# Patient Record
Sex: Female | Born: 1990 | Race: Black or African American | Hispanic: No | Marital: Single | Smoking: Former smoker
Health system: Southern US, Community
[De-identification: ages and names within clinical notes are randomized; demographics above are authoritative.]

## PROBLEM LIST (undated history)

## (undated) DIAGNOSIS — F32A Depression, unspecified: Secondary | ICD-10-CM

## (undated) DIAGNOSIS — I10 Essential (primary) hypertension: Secondary | ICD-10-CM

## (undated) DIAGNOSIS — G43909 Migraine, unspecified, not intractable, without status migrainosus: Secondary | ICD-10-CM

## (undated) DIAGNOSIS — J45909 Unspecified asthma, uncomplicated: Secondary | ICD-10-CM

## (undated) HISTORY — DX: Migraine, unspecified, not intractable, without status migrainosus: G43.909

## (undated) HISTORY — DX: Essential (primary) hypertension: I10

---

## 2020-11-03 ENCOUNTER — Ambulatory Visit (INDEPENDENT_AMBULATORY_CARE_PROVIDER_SITE_OTHER): Payer: Medicaid Other | Admitting: Obstetrics and Gynecology

## 2020-11-03 ENCOUNTER — Encounter: Payer: Self-pay | Admitting: Obstetrics and Gynecology

## 2020-11-03 ENCOUNTER — Other Ambulatory Visit: Payer: Self-pay

## 2020-11-03 VITALS — BP 129/84 | HR 71 | Ht 62.0 in | Wt 206.3 lb

## 2020-11-03 DIAGNOSIS — Z3A36 36 weeks gestation of pregnancy: Secondary | ICD-10-CM

## 2020-11-03 DIAGNOSIS — O34219 Maternal care for unspecified type scar from previous cesarean delivery: Secondary | ICD-10-CM

## 2020-11-03 DIAGNOSIS — Z5900 Homelessness unspecified: Secondary | ICD-10-CM

## 2020-11-03 DIAGNOSIS — O0933 Supervision of pregnancy with insufficient antenatal care, third trimester: Secondary | ICD-10-CM | POA: Diagnosis not present

## 2020-11-03 DIAGNOSIS — Z7689 Persons encountering health services in other specified circumstances: Secondary | ICD-10-CM

## 2020-11-03 LAB — POCT URINALYSIS DIPSTICK OB
Bilirubin, UA: NEGATIVE
Blood, UA: NEGATIVE
Glucose, UA: NEGATIVE
Ketones, UA: NEGATIVE
Leukocytes, UA: NEGATIVE
Nitrite, UA: NEGATIVE
POC,PROTEIN,UA: NEGATIVE
Spec Grav, UA: 1.01 (ref 1.010–1.025)
Urobilinogen, UA: 0.2 E.U./dL
pH, UA: 7 (ref 5.0–8.0)

## 2020-11-03 NOTE — Progress Notes (Signed)
HPI:      [name] is a 30 y.o. [phone] who LMP was Patient's last menstrual period was 02/15/2020.  Subjective:   She presents today to establish care at our practice.  She has had very limited prenatal care during this pregnancy.  She is currently homeless and living in her car with her 3 children.  Abigail Watts is working with her to find housing/shelter. Her previous pregnancies were somewhat complicated.  She had 3 prior cesarean deliveries at term.  She had a previous ectopic pregnancy. She had an extensive visit approximately 2 weeks ago for prenatal care.  She had an ultrasound at that time.  She has completed her 1 hour GCT, is vaccinated for Covid, has done [facility] and chlamydia testing.  She has not yet done GBS. Patient states that she has a history of hypertension but she has not been hypertensive during this pregnancy. She says that she has depression and anxiety.  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  She has been taking it for less than 2 weeks. She reports active daily fetal movement.  She does complain of irregular daily contractions.    Hx: The following portions of the patient's history were reviewed and updated as appropriate:             She  has a past medical history of Hypertension and Migraines. She does not have a problem list on file. She  has no past surgical history on file. Her family history is not on file. She  reports that she has quit smoking. She has never used smokeless tobacco. She reports current alcohol use. She reports previous drug use. She has a current medication list which includes the following prescription(s): acetaminophen-codeine, multivitamin-prenatal, and sertraline. She is allergic to lisinopril and latex.       Review of Systems:  Review of Systems  Constitutional: Denied constitutional symptoms, night sweats, recent illness,  fatigue, fever, insomnia and weight loss.  Eyes: Denied eye symptoms, eye pain, photophobia, vision change and visual disturbance.  Ears/Nose/Throat/Neck: Denied ear, nose, throat or neck symptoms, hearing loss, nasal discharge, sinus congestion and sore throat.  Cardiovascular: Denied cardiovascular symptoms, arrhythmia, chest pain/pressure, edema, exercise intolerance, orthopnea and palpitations.  Respiratory: Denied pulmonary symptoms, asthma, pleuritic pain, productive sputum, cough, dyspnea and wheezing.  Gastrointestinal: Denied, gastro-esophageal reflux, melena, nausea and vomiting.  Genitourinary: Denied genitourinary symptoms including symptomatic vaginal discharge, pelvic relaxation issues, and urinary complaints.  Musculoskeletal: Denied musculoskeletal symptoms, stiffness, swelling, muscle weakness and myalgia.  Dermatologic: Denied dermatology symptoms, rash and scar.  Neurologic: Denied neurology symptoms, dizziness, headache, neck pain and syncope.  Psychiatric: Denied psychiatric symptoms, anxiety and depression.  Endocrine: Denied endocrine symptoms including hot flashes and night sweats.   Meds:   Current Outpatient Medications on File Prior to Visit  Medication Sig Dispense Refill  . acetaminophen-codeine (TYLENOL #3) 300-30 MG tablet Take by mouth.    . Prenatal Vit-Fe Fumarate-FA (MULTIVITAMIN-PRENATAL) 27-0.8 MG TABS tablet Take 1 tablet by mouth daily at 12 noon.    . sertraline (ZOLOFT) 25 MG tablet Take by mouth.     No current facility-administered medications on file prior to visit.          Objective:     Vitals:   11/03/20 1114  BP: 129/84  Pulse: 71   Filed Weights   11/03/20 1114  Weight: 206 lb  4.8 oz (93.6 kg)              Positive fetal heart tones 138 bpm    Assessment:    G5P3013 There are no problems to display for this patient.    1. Encounter to establish care   2. [redacted] weeks gestation of pregnancy   3. Limited prenatal care in  third trimester   4. Previous cesarean delivery affecting pregnancy, antepartum   5. Homeless family        Plan:            1.  Coordinate with Rose to attempt housing.  2.  Cesarean delivery scheduled for 1/28. (Repeat)  3.  GBS done today  4.  Discussed use of Zoloft and depression and taking medication as directed. Orders Orders Placed This Encounter  Procedures  . POC Urinalysis Dipstick OB    No orders of the defined types were placed in this encounter.     F/U  Return in about 1 week (around 11/10/2020) for [facility]. I spent 48 minutes involved in the care of this patient preparing to see the patient by obtaining and reviewing her medical history (including labs, imaging tests and prior procedures), documenting clinical information in the electronic health record (EHR), counseling and coordinating care plans, writing and sending prescriptions, ordering tests or procedures and directly communicating with the patient by discussing pertinent items from her history and physical exam as well as detailing my assessment and plan as noted above so that she has an informed understanding.  All of her questions were answered.  Elonda Husky, M.D. 11/03/2020 12:08 PM

## 2020-11-07 ENCOUNTER — Encounter: Payer: Medicaid Other | Admitting: Obstetrics and Gynecology

## 2020-11-08 LAB — STREP GP B NAA: Strep Gp B NAA: POSITIVE — AB

## 2020-11-15 ENCOUNTER — Telehealth: Payer: Self-pay

## 2020-11-15 NOTE — Telephone Encounter (Signed)
Called pt and add her to dr. Valentino Saxon for 11

## 2020-11-15 NOTE — Telephone Encounter (Signed)
Please schedule with Abigail Watts since she no showed the visit with [facility].

## 2020-11-15 NOTE — Telephone Encounter (Signed)
pt called in and stated that she is have pain in her left leg. The pt said that she is having some discharge. the pt is requesting to see the doctor before she has her c-section. I told the pt I will send a message to the nurse due to her discomfort and that a nurse will be in touch. Please advise

## 2020-11-16 ENCOUNTER — Telehealth: Payer: Self-pay

## 2020-11-16 ENCOUNTER — Encounter: Payer: Medicaid Other | Admitting: Obstetrics and Gynecology

## 2020-11-16 NOTE — Telephone Encounter (Signed)
Tried to call patient went straight to voicemail. Voicemail not set up.

## 2020-11-16 NOTE — Telephone Encounter (Signed)
Patient called in stating that she needed to cancel her appointment for today as she states she won't be able to come into the office. Patient states that she would like a nurse to give her a call back in regards to braxton hick contractions she is having, patient states that pain is shooting down her left leg. Patient denies any loss of mucus plug, vaginal bleeding, or discharge. Informed patient I would send a message back to her provider and that the nurse would be in touch.   Could you please advise?

## 2020-11-21 ENCOUNTER — Other Ambulatory Visit
Admission: RE | Admit: 2020-11-21 | Discharge: 2020-11-21 | Disposition: A | Discharge status: Home / Self Care | Payer: Medicaid Other | Source: Ambulatory Visit | Attending: Obstetrics and Gynecology | Admitting: Obstetrics and Gynecology

## 2020-11-21 ENCOUNTER — Other Ambulatory Visit: Payer: Self-pay

## 2020-11-21 HISTORY — DX: Depression, unspecified: F32.A

## 2020-11-21 HISTORY — DX: Unspecified asthma, uncomplicated: J45.909

## 2020-11-21 NOTE — Patient Instructions (Signed)
Your procedure is scheduled on: Friday November 24, 2020. Report to Day [facility] inside [facility] (stop by Admissions desk first). To find out your arrival time please call [phone] between 1PM - 3PM on Thursday November 23, 2020.  Remember: Instructions that are not followed completely may result in serious medical risk,  up to and including death, or upon the discretion of your surgeon and anesthesiologist your  surgery may need to be rescheduled.     _X__ 1. Do not eat food after midnight the night before your procedure.                 No chewing gum or hard candies. You may drink clear liquids up to 2 hours                 before you are scheduled to arrive for your surgery- DO not drink clear                 liquids within 2 hours of the start of your surgery.                 Clear Liquids include:  water, apple juice without pulp, clear Gatorade, G2 or                  Gatorade Zero (avoid Red/Purple/Blue), Black Coffee or Tea (Do not add                 anything to coffee or tea).  __X__2.  On the morning of surgery brush your teeth with toothpaste and water, you                may rinse your mouth with mouthwash if you wish.  Do not swallow any toothpaste of mouthwash.     _X__ 3.  No Alcohol for 24 hours before or after surgery.   _X__ 4.  Do Not Smoke or use e-cigarettes For 24 Hours Prior to Your Surgery.                 Do not use any chewable tobacco products for at least 6 hours prior to                 Surgery.  _X__  5.  Do not use any recreational drugs (marijuana, cocaine, heroin, ecstasy, MDMA or other)                For at least one week prior to your surgery.  Combination of these drugs with anesthesia                May have life threatening results.  __X__ 6.  Notify your doctor if there is any change in your medical condition      (cold, fever, infections).     Do not wear jewelry, make-up, hairpins, clips or nail polish. Do not  wear lotions, powders, or perfumes. You may wear deodorant. Do not shave 48 hours prior to surgery. Men may shave face and neck. Do not bring valuables to the hospital.    [facility] is not responsible for any belongings or valuables.  Contacts, dentures or bridgework may not be worn into surgery. Leave your suitcase in the car. After surgery it may be brought to your room. For patients admitted to the hospital, discharge time is determined by your treatment team.   Patients discharged the day of surgery will not be allowed to drive home.   Make arrangements for  someone to be with you for the first 24 hours of your Same Day Discharge.    __X__ Take these medicines the morning of surgery with A SIP OF WATER:    1. None     ____ Fleet Enema (as directed)   __X__ Use CHG Soap (or wipes) as directed  ____ Use Benzoyl Peroxide Gel as instructed  ____ Use inhalers on the day of surgery  ____ Stop metformin 2 days prior to surgery     __X__ Stop Anti-inflammatories Ibuprofen, Aleve, Advil, naproxen, aspirin and or BC powders.    __X__ Stop supplements until after surgery.    __X__ Do not start any herbal supplements before your procedure.     If you have any questions regarding your pre-procedure instructions,  Please call Pre-admit Testing at [phone].

## 2020-11-22 ENCOUNTER — Encounter
Admission: RE | Admit: 2020-11-22 | Discharge: 2020-11-22 | Disposition: A | Discharge status: Home / Self Care | Payer: Medicaid Other | Source: Ambulatory Visit | Attending: Obstetrics and Gynecology | Admitting: Obstetrics and Gynecology

## 2020-11-22 ENCOUNTER — Other Ambulatory Visit
Admission: RE | Admit: 2020-11-22 | Discharge: 2020-11-22 | Disposition: A | Discharge status: Home / Self Care | Payer: Medicaid Other | Source: Ambulatory Visit | Attending: Obstetrics and Gynecology | Admitting: Obstetrics and Gynecology

## 2020-11-22 DIAGNOSIS — Z01812 Encounter for preprocedural laboratory examination: Secondary | ICD-10-CM | POA: Insufficient documentation

## 2020-11-22 DIAGNOSIS — Z20822 Contact with and (suspected) exposure to covid-19: Secondary | ICD-10-CM | POA: Diagnosis not present

## 2020-11-22 LAB — CBC
HCT: 32.8 % — ABNORMAL LOW (ref 36.0–46.0)
Hemoglobin: 10.5 g/dL — ABNORMAL LOW (ref 12.0–15.0)
MCH: 24.1 pg — ABNORMAL LOW (ref 26.0–34.0)
MCHC: 32 g/dL (ref 30.0–36.0)
MCV: 75.4 fL — ABNORMAL LOW (ref 80.0–100.0)
Platelets: 165 10*3/uL (ref 150–400)
RBC: 4.35 MIL/uL (ref 3.87–5.11)
RDW: 15.2 % (ref 11.5–15.5)
WBC: 5.8 10*3/uL (ref 4.0–10.5)
nRBC: 0 % (ref 0.0–0.2)

## 2020-11-22 LAB — SARS CORONAVIRUS 2 (TAT 6-24 HRS): SARS Coronavirus 2: NEGATIVE

## 2020-11-22 LAB — TYPE AND SCREEN
ABO/RH(D): B POS
Antibody Screen: NEGATIVE
Extend sample reason: UNDETERMINED

## 2020-11-22 LAB — HCG, QUANTITATIVE, PREGNANCY: hCG, Beta Chain, Quant, S: 12186 m[IU]/mL — ABNORMAL HIGH (ref <5)

## 2020-11-23 ENCOUNTER — Ambulatory Visit (INDEPENDENT_AMBULATORY_CARE_PROVIDER_SITE_OTHER): Payer: Medicaid Other | Admitting: Obstetrics and Gynecology

## 2020-11-23 ENCOUNTER — Other Ambulatory Visit: Payer: Self-pay

## 2020-11-23 ENCOUNTER — Encounter: Payer: Self-pay | Admitting: Obstetrics and Gynecology

## 2020-11-23 VITALS — BP 141/84 | HR 91 | Wt 211.7 lb

## 2020-11-23 DIAGNOSIS — O0933 Supervision of pregnancy with insufficient antenatal care, third trimester: Secondary | ICD-10-CM

## 2020-11-23 DIAGNOSIS — Z3A38 38 weeks gestation of pregnancy: Secondary | ICD-10-CM

## 2020-11-23 DIAGNOSIS — O34219 Maternal care for unspecified type scar from previous cesarean delivery: Secondary | ICD-10-CM

## 2020-11-23 NOTE — Progress Notes (Signed)
ROB: Patient has no specific complaints.  We discussed her surgery for tomorrow.  N.p.o. after midnight discussed.  Postop recovery and care in the hospital discussed.  All questions answered.

## 2020-11-23 NOTE — Telephone Encounter (Signed)
Spoke with patient and scheduled her to come in today to be seen.

## 2020-11-24 ENCOUNTER — Encounter
Admission: RE | Disposition: A | Discharge status: Home / Self Care | Payer: Self-pay | Source: Home / Self Care | Attending: Obstetrics and Gynecology

## 2020-11-24 ENCOUNTER — Encounter: Payer: Self-pay | Admitting: Obstetrics and Gynecology

## 2020-11-24 ENCOUNTER — Other Ambulatory Visit: Payer: Self-pay

## 2020-11-24 ENCOUNTER — Inpatient Hospital Stay: Payer: Medicaid Other | Admitting: Anesthesiology

## 2020-11-24 ENCOUNTER — Inpatient Hospital Stay
Admission: RE | Admit: 2020-11-24 | Discharge: 2020-11-26 | DRG: 788 | Disposition: A | Discharge status: Home / Self Care | Payer: Medicaid Other | Attending: Obstetrics and Gynecology | Admitting: Obstetrics and Gynecology

## 2020-11-24 DIAGNOSIS — O99824 Streptococcus B carrier state complicating childbirth: Secondary | ICD-10-CM

## 2020-11-24 DIAGNOSIS — O99344 Other mental disorders complicating childbirth: Secondary | ICD-10-CM | POA: Diagnosis present

## 2020-11-24 DIAGNOSIS — Z87891 Personal history of nicotine dependence: Secondary | ICD-10-CM

## 2020-11-24 DIAGNOSIS — O99893 Other specified diseases and conditions complicating puerperium: Secondary | ICD-10-CM | POA: Diagnosis present

## 2020-11-24 DIAGNOSIS — R109 Unspecified abdominal pain: Secondary | ICD-10-CM | POA: Diagnosis present

## 2020-11-24 DIAGNOSIS — F32A Depression, unspecified: Secondary | ICD-10-CM | POA: Diagnosis present

## 2020-11-24 DIAGNOSIS — O34211 Maternal care for low transverse scar from previous cesarean delivery: Secondary | ICD-10-CM | POA: Diagnosis present

## 2020-11-24 DIAGNOSIS — O0933 Supervision of pregnancy with insufficient antenatal care, third trimester: Secondary | ICD-10-CM | POA: Diagnosis not present

## 2020-11-24 DIAGNOSIS — Z3A39 39 weeks gestation of pregnancy: Secondary | ICD-10-CM

## 2020-11-24 DIAGNOSIS — O0973 Supervision of high risk pregnancy due to social problems, third trimester: Secondary | ICD-10-CM | POA: Diagnosis not present

## 2020-11-24 DIAGNOSIS — Z5901 Sheltered homelessness: Secondary | ICD-10-CM

## 2020-11-24 DIAGNOSIS — Z3490 Encounter for supervision of normal pregnancy, unspecified, unspecified trimester: Secondary | ICD-10-CM

## 2020-11-24 LAB — ABO/RH: ABO/RH(D): B POS

## 2020-11-24 LAB — RAPID HIV SCREEN (HIV 1/2 AB+AG)
HIV 1/2 Antibodies: NONREACTIVE
HIV-1 P24 Antigen - HIV24: NONREACTIVE

## 2020-11-24 SURGERY — Surgical Case
Anesthesia: Spinal

## 2020-11-24 MED ORDER — CEFAZOLIN SODIUM-DEXTROSE 2-4 GM/100ML-% IV SOLN
INTRAVENOUS | Status: AC
  Filled 2020-11-24: qty 100

## 2020-11-24 MED ORDER — MEPERIDINE HCL 25 MG/ML IJ SOLN: 6.2500 mg | INTRAMUSCULAR | Status: DC | PRN

## 2020-11-24 MED ORDER — SODIUM CHLORIDE 0.9% FLUSH: 3.0000 mL | INTRAVENOUS | Status: DC | PRN

## 2020-11-24 MED ORDER — ORAL CARE MOUTH RINSE: 15.0000 mL | Freq: Once | OROMUCOSAL | Status: DC

## 2020-11-24 MED ORDER — FLUCONAZOLE 50 MG PO TABS
150.0000 mg | ORAL_TABLET | Freq: Once | ORAL | Status: AC
  Administered 2020-11-24: 150 mg via ORAL
  Filled 2020-11-24: qty 1

## 2020-11-24 MED ORDER — FENTANYL CITRATE (PF) 100 MCG/2ML IJ SOLN
INTRAMUSCULAR | Status: AC
  Filled 2020-11-24: qty 2

## 2020-11-24 MED ORDER — SOD CITRATE-CITRIC ACID 500-334 MG/5ML PO SOLN
ORAL | Status: AC
  Administered 2020-11-24: 15 mL via ORAL
  Filled 2020-11-24: qty 15

## 2020-11-24 MED ORDER — NALBUPHINE HCL 10 MG/ML IJ SOLN
5.0000 mg | Freq: Once | INTRAMUSCULAR | Status: AC | PRN
Start: 2020-11-24 — End: 2020-11-24
  Administered 2020-11-24: 5 mg via INTRAVENOUS
  Filled 2020-11-24: qty 1

## 2020-11-24 MED ORDER — NALOXONE HCL 4 MG/10ML IJ SOLN
1.0000 ug/kg/h | INTRAVENOUS | Status: DC | PRN
  Filled 2020-11-24: qty 5

## 2020-11-24 MED ORDER — BUPIVACAINE IN DEXTROSE 0.75-8.25 % IT SOLN
INTRATHECAL | Status: DC | PRN
  Administered 2020-11-24: 1.6 mL via INTRATHECAL

## 2020-11-24 MED ORDER — KETOROLAC TROMETHAMINE 30 MG/ML IJ SOLN
30.0000 mg | Freq: Four times a day (QID) | INTRAMUSCULAR | Status: AC | PRN
  Administered 2020-11-24 – 2020-11-25 (×3): 30 mg via INTRAVENOUS
  Filled 2020-11-24 (×3): qty 1

## 2020-11-24 MED ORDER — ONDANSETRON HCL 4 MG/2ML IJ SOLN
INTRAMUSCULAR | Status: DC | PRN
  Administered 2020-11-24: 4 mg via INTRAVENOUS

## 2020-11-24 MED ORDER — PROMETHAZINE HCL 25 MG/ML IJ SOLN: 6.2500 mg | INTRAMUSCULAR | Status: DC | PRN

## 2020-11-24 MED ORDER — SIMETHICONE 80 MG PO CHEW
80.0000 mg | CHEWABLE_TABLET | Freq: Four times a day (QID) | ORAL | Status: DC | PRN
  Administered 2020-11-24 – 2020-11-25 (×2): 80 mg via ORAL
  Filled 2020-11-24 (×3): qty 1

## 2020-11-24 MED ORDER — ONDANSETRON HCL 4 MG/2ML IJ SOLN
INTRAMUSCULAR | Status: AC
  Filled 2020-11-24: qty 2

## 2020-11-24 MED ORDER — KETOROLAC TROMETHAMINE 30 MG/ML IJ SOLN
INTRAMUSCULAR | Status: AC
  Filled 2020-11-24: qty 1

## 2020-11-24 MED ORDER — PHENYLEPHRINE HCL (PRESSORS) 10 MG/ML IV SOLN
INTRAVENOUS | Status: DC | PRN
  Administered 2020-11-24: 100 ug via INTRAVENOUS

## 2020-11-24 MED ORDER — OXYTOCIN-SODIUM CHLORIDE 30-0.9 UT/500ML-% IV SOLN
INTRAVENOUS | Status: DC | PRN
  Administered 2020-11-24: 30 [IU] via INTRAVENOUS

## 2020-11-24 MED ORDER — FENTANYL CITRATE (PF) 100 MCG/2ML IJ SOLN
INTRAMUSCULAR | Status: DC | PRN
  Administered 2020-11-24: 15 ug via INTRATHECAL

## 2020-11-24 MED ORDER — OXYTOCIN-SODIUM CHLORIDE 30-0.9 UT/500ML-% IV SOLN
INTRAVENOUS | Status: AC
  Filled 2020-11-24: qty 500

## 2020-11-24 MED ORDER — NALOXONE HCL 0.4 MG/ML IJ SOLN: 0.4000 mg | INTRAMUSCULAR | Status: DC | PRN

## 2020-11-24 MED ORDER — LACTATED RINGERS IV SOLN: INTRAVENOUS | Status: DC

## 2020-11-24 MED ORDER — DIPHENHYDRAMINE HCL 50 MG/ML IJ SOLN: 12.5000 mg | INTRAMUSCULAR | Status: DC | PRN

## 2020-11-24 MED ORDER — NALBUPHINE HCL 10 MG/ML IJ SOLN: 5.0000 mg | INTRAMUSCULAR | Status: DC | PRN

## 2020-11-24 MED ORDER — LACTATED RINGERS IV SOLN: Freq: Once | INTRAVENOUS | Status: DC

## 2020-11-24 MED ORDER — NALBUPHINE HCL 10 MG/ML IJ SOLN
5.0000 mg | Freq: Once | INTRAMUSCULAR | Status: AC | PRN
Start: 2020-11-24 — End: 2020-11-24

## 2020-11-24 MED ORDER — HYDROCODONE-ACETAMINOPHEN 5-325 MG PO TABS
1.0000 | ORAL_TABLET | Freq: Four times a day (QID) | ORAL | Status: DC | PRN
  Administered 2020-11-24 – 2020-11-25 (×4): 2 via ORAL
  Filled 2020-11-24 (×5): qty 2

## 2020-11-24 MED ORDER — SODIUM CHLORIDE 0.9 % IV SOLN
INTRAVENOUS | Status: DC | PRN
  Administered 2020-11-24: 40 ug/min via INTRAVENOUS

## 2020-11-24 MED ORDER — ONDANSETRON HCL 4 MG/2ML IJ SOLN
4.0000 mg | Freq: Three times a day (TID) | INTRAMUSCULAR | Status: DC | PRN
Start: 2020-11-24 — End: 2020-11-26

## 2020-11-24 MED ORDER — OXYCODONE HCL 5 MG PO TABS
5.0000 mg | ORAL_TABLET | ORAL | Status: DC | PRN
  Administered 2020-11-24 – 2020-11-25 (×4): 5 mg via ORAL
  Filled 2020-11-24 (×3): qty 1

## 2020-11-24 MED ORDER — CHLORHEXIDINE GLUCONATE 0.12 % MT SOLN
15.0000 mL | Freq: Once | OROMUCOSAL | Status: DC
  Filled 2020-11-24: qty 15

## 2020-11-24 MED ORDER — POVIDONE-IODINE 10 % EX SWAB: 2.0000 "application " | Freq: Once | CUTANEOUS | Status: DC

## 2020-11-24 MED ORDER — DIPHENHYDRAMINE HCL 25 MG PO CAPS: 25.0000 mg | ORAL_CAPSULE | ORAL | Status: DC | PRN

## 2020-11-24 MED ORDER — MORPHINE SULFATE (PF) 0.5 MG/ML IJ SOLN
INTRAMUSCULAR | Status: AC
  Filled 2020-11-24: qty 10

## 2020-11-24 MED ORDER — FENTANYL CITRATE (PF) 100 MCG/2ML IJ SOLN: 25.0000 ug | INTRAMUSCULAR | Status: DC | PRN

## 2020-11-24 MED ORDER — MORPHINE SULFATE (PF) 0.5 MG/ML IJ SOLN
INTRAMUSCULAR | Status: DC | PRN
  Administered 2020-11-24: .1 mg via INTRATHECAL

## 2020-11-24 MED ORDER — KETOROLAC TROMETHAMINE 30 MG/ML IJ SOLN: 30.0000 mg | Freq: Four times a day (QID) | INTRAMUSCULAR | Status: AC | PRN

## 2020-11-24 MED ORDER — OXYCODONE HCL 5 MG PO TABS
ORAL_TABLET | ORAL | Status: AC
  Filled 2020-11-24: qty 1

## 2020-11-24 MED ORDER — FAMOTIDINE 20 MG PO TABS
20.0000 mg | ORAL_TABLET | Freq: Once | ORAL | Status: DC
  Filled 2020-11-24: qty 1

## 2020-11-24 MED ORDER — CEFAZOLIN SODIUM-DEXTROSE 2-4 GM/100ML-% IV SOLN
2.0000 g | Freq: Once | INTRAVENOUS | Status: AC
  Administered 2020-11-24: 2 g via INTRAVENOUS

## 2020-11-24 MED ORDER — LIDOCAINE 5 % EX PTCH
MEDICATED_PATCH | CUTANEOUS | Status: AC
  Filled 2020-11-24: qty 1

## 2020-11-24 MED ORDER — SOD CITRATE-CITRIC ACID 500-334 MG/5ML PO SOLN
ORAL | Status: DC | PRN
  Administered 2020-11-24: 30 mL via ORAL

## 2020-11-24 MED ORDER — KETOROLAC TROMETHAMINE 30 MG/ML IJ SOLN
INTRAMUSCULAR | Status: DC | PRN
  Administered 2020-11-24: 30 mg via INTRAVENOUS

## 2020-11-24 SURGICAL SUPPLY — 27 items
ADHESIVE MASTISOL STRL (MISCELLANEOUS) ×2 IMPLANT
BAG COUNTER SPONGE EZ (MISCELLANEOUS) ×2 IMPLANT
CANISTER SUCT 3000ML PPV (MISCELLANEOUS) ×2 IMPLANT
CELL SAVER LIPIGURD (MISCELLANEOUS) ×1 IMPLANT
CHLORAPREP W/TINT 26 (MISCELLANEOUS) ×4 IMPLANT
COVER WAND RF STERILE (DRAPES) ×2 IMPLANT
DRSG TELFA 3X8 NADH (GAUZE/BANDAGES/DRESSINGS) ×2 IMPLANT
EXTRT SYSTEM ALEXIS 14CM (MISCELLANEOUS) ×2
GAUZE SPONGE 4X4 12PLY STRL (GAUZE/BANDAGES/DRESSINGS) ×2 IMPLANT
GLOVE BIOGEL PI ORTHO PRO 7.5 (GLOVE) ×1
GLOVE PI ORTHO PRO STRL 7.5 (GLOVE) ×1 IMPLANT
GOWN STRL REUS W/ TWL LRG LVL3 (GOWN DISPOSABLE) ×2 IMPLANT
GOWN STRL REUS W/TWL LRG LVL3 (GOWN DISPOSABLE) ×2
KIT TURNOVER KIT A (KITS) ×2 IMPLANT
MANIFOLD NEPTUNE II (INSTRUMENTS) ×2 IMPLANT
NS IRRIG 1000ML POUR BTL (IV SOLUTION) ×2 IMPLANT
PACK C SECTION AR (MISCELLANEOUS) ×2 IMPLANT
PAD OB MATERNITY 4.3X12.25 (PERSONAL CARE ITEMS) ×2 IMPLANT
PAD PREP 24X41 OB/GYN DISP (PERSONAL CARE ITEMS) ×2 IMPLANT
PENCIL SMOKE ULTRAEVAC 22 CON (MISCELLANEOUS) ×2 IMPLANT
RETRACTOR WND ALEXIS-O 25 LRG (MISCELLANEOUS) ×1 IMPLANT
RTRCTR WOUND ALEXIS O 25CM LRG (MISCELLANEOUS) ×2
SPONGE LAP 18X18 RF (DISPOSABLE) ×2 IMPLANT
SUT VIC AB 0 CTX 36 (SUTURE) ×2
SUT VIC AB 0 CTX36XBRD ANBCTRL (SUTURE) ×2 IMPLANT
SUT VIC AB 1 CT1 36 (SUTURE) ×4 IMPLANT
SUT VICRYL+ 3-0 36IN CT-1 (SUTURE) ×4 IMPLANT

## 2020-11-24 NOTE — Lactation Note (Signed)
This note was copied from a baby's chart. Lactation Consultation Note  Patient Name: Abigail Watts LSLHT'D Date: 11/24/2020 Reason for consult: L&D Initial assessment;Term;Infant weight loss;Other (Comment) (limited prenatal care) Age:29 hours  Maternal Data Formula Feeding for Exclusion: No Does the patient have breastfeeding experience prior to this delivery?: No Formula fed her other children Feeding  Had expressed a desire to pump and bottle feed, when educated how often and how she would need to pump, at least every 3 hrs, she indicated that she would not be able to to do this, she states she has an electric pump that ?[employer] gave her, she does not know the name brand, she is unsure about breastfeeding, had attempted at breast earlier but unable to latch baby, since baby has not fed at all since birth, she has decided to formula feed for now and think about breastfeeding, I informed her that I would keep checking with her and that I would help her with breastfeeding the baby if she desires to try this.  I informed Elmarie Shiley Denmark about her desire to formula feed at present and she will assist with the formula feeding.   LATCH Score Latch:  (no feeding observed)                 Interventions Interventions: Breast feeding basics reviewed, pumping frequency and info about pumping given  Lactation Tools Discussed/Used WIC Program: Yes   Consult Status Consult Status: PRN    [name] 11/24/2020, 10:34 AM

## 2020-11-24 NOTE — Lactation Note (Signed)
This note was copied from a baby's chart. Lactation Consultation Note  Patient Name: Abigail Watts ZOXWR'U Date: 11/24/2020 Reason for consult: Follow-up assessment;Term Age:29 hours Mom has decided to pump and bottlefeed, DEBP set up with instruction in use and cleaning, mom pumped in initiation mode and obtained a few drops of colostrum, she  Was instructed to pump again at 1930 and to call for assistance as needed with pumping and cleaning parts. Maternal Data Formula Feeding for Exclusion: No Does the patient have breastfeeding experience prior to this delivery?: No  Feeding Feeding Type: Bottle Fed - Formula Nipple Type: Slow - flow  LATCH Score                   Interventions Interventions: DEBP  Lactation Tools Discussed/Used Pump Education: Setup, frequency, and cleaning;Milk Storage Initiated by:: Cay Schillings RNC IBCLC Date initiated:: 11/24/20   Consult Status Consult Status: Follow-up Date: 11/25/20 Follow-up type: In-patient    [name] 11/24/2020, 6:05 PM

## 2020-11-24 NOTE — TOC Initial Note (Signed)
Transition of Care [facility]) - Initial/Assessment Note    Patient Details  Name: Abigail Watts MRN: [id number] Date of Birth: 13-Nov-1990  Transition of [facility]) CM/SW Contact:    Gholson Cellar, RN Phone Number: 11/24/2020, 2:54 PM  Clinical Narrative:                 RN CM spoke with patient at bedside regarding concerns for homelessness. Patient states she iscoming out of a rough situation due to COVID where she was living out of her car for a short period of time. Currently she and her 3 children are living with her grandmother in [place]. Patient has gotten a job at [employer] through a [employer] and will be starting once released from OB/GYN following Csection. Patient has a vehicle and drives. Grandmother watches the children while she works however they are currently staying with her aunt in [place]. Patient reports no person in the home smoke and she understands importance of hand hygiene. Patient will be bottlefeeding. Has WIC through [facility] and is working to transfer to [facility]. Patient is planning to move all her children to [facility]. Has no equipment other than one outfit that DSS Case worker-Kaitlyn provided to her. Patient reports she was working with Okey Dupre, SW at [employer]. 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         Patient Goals and CMS Choice        Expected Discharge Plan and Services                                                Prior Living Arrangements/Services                       Activities of Daily Living Home Assistive Devices/Equipment: None ADL Screening (condition at time of admission) Patient's cognitive ability adequate to safely complete daily activities?: Yes Is the patient deaf or have difficulty hearing?: No Does the patient have difficulty seeing, even when wearing glasses/contacts?: No Does the patient have difficulty  concentrating, remembering, or making decisions?: No Patient able to express need for assistance with ADLs?: No Does the patient have difficulty dressing or bathing?: No Independently performs ADLs?: Yes (appropriate for developmental age) Does the patient have difficulty walking or climbing stairs?: No Weakness of Legs: None Weakness of Arms/Hands: None  Permission Sought/Granted                  Emotional Assessment              Admission diagnosis:  Term Intrauterine pregnancy for repeat cesarean There are no problems to display for this patient.  PCP:  Linzie Collin, MD Pharmacy:   [facility] [place], [place] - [address] [place] [address] Phone: [phone] Fax: [phone]     Social Determinants of Health (SDOH) Interventions    Readmission Risk Interventions No flowsheet data found.

## 2020-11-24 NOTE — Transfer of Care (Signed)
Immediate Anesthesia Transfer of Care Note  Patient: Abigail Watts  Procedure(s) Performed: CESAREAN SECTION (N/A )  Patient Location: PACU  Anesthesia Type:Spinal  Level of Consciousness: awake, alert  and oriented  Airway & Oxygen Therapy: Patient Spontanous Breathing  Post-op Assessment: Report given to RN and Post -op Vital signs reviewed and stable  Post vital signs: Reviewed and stable  Last Vitals:  Vitals Value Taken Time  BP 110/64 11/24/20 0907  Temp    Pulse 60 11/24/20 0907  Resp 23 11/24/20 0907  SpO2 98 % 11/24/20 0907    Last Pain:  Vitals:   11/24/20 0354  TempSrc: Oral         Complications: No complications documented.

## 2020-11-24 NOTE — Anesthesia Procedure Notes (Signed)
Spinal  Patient location during procedure: OR Start time: 11/24/2020 7:52 AM End time: 11/24/2020 7:56 AM Staffing Performed: resident/CRNA  Anesthesiologist: Martha Clan, MD Resident/CRNA: Hedda Slade, CRNA Preanesthetic Checklist Completed: patient identified, IV checked, site marked, risks and benefits discussed, surgical consent, monitors and equipment checked, pre-op evaluation and timeout performed Spinal Block Patient position: sitting Prep: ChloraPrep Patient monitoring: heart rate, continuous pulse ox, blood pressure and cardiac monitor Approach: midline Location: L3-4 Injection technique: single-shot Needle Needle type: Whitacre and Introducer  Needle gauge: 24 G Needle length: 9 cm Assessment Sensory level: T6 Additional Notes Negative paresthesia. Negative blood return. Positive free-flowing CSF. Expiration date of kit checked and confirmed. Patient tolerated procedure well, without complications.

## 2020-11-24 NOTE — H&P (Signed)
History and Physical   HPI  Abigail Watts is a 30 y.o. [id number] at [redacted]w[redacted]d Estimated Date of Delivery: 12/01/20 who is being admitted for C-section - pt for repeat. Limited prenatal care.   OB History  OB History  Gravida Para Term Preterm AB Living  5 3 3  0 1 3  SAB IAB Ectopic Multiple Live Births  0 0 1 0 3    # Outcome Date GA Lbr Len/2nd Weight Sex Delivery Anes PTL Lv  5 Current           4 Term 08/13/18    M CS-LTranv   LIV  3 Ectopic 05/11/14          2 Term 03/16/14    M CS-LTranv  Y LIV  1 Term 07/14/07    F CS-LTranv   LIV    PROBLEM LIST  Pregnancy complications or risks: There are no problems to display for this patient.   Prenatal labs and studies: ABO, Rh: --/--/B POS Performed at [facility], [address] [place], [place]  [phone]) Antibody: NEG (01/26 1030) Rubella:   RPR:    HBsAg:    HIV:    [birth year]1980-- (01/10 03-24-1991)   Past Medical History:  Diagnosis Date  . Asthma   . Depression   . Hypertension   . Migraines      Past Surgical History:  Procedure Laterality Date  . CESAREAN SECTION     xs 3     Medications    Current Discharge Medication List    CONTINUE these medications which have NOT CHANGED   Details  acetaminophen (TYLENOL) 500 MG tablet Take 500 mg by mouth every 6 (six) hours as needed for moderate pain.    Prenatal Vit-Fe Fumarate-FA (MULTIVITAMIN-PRENATAL) 27-0.8 MG TABS tablet Take 1 tablet by mouth daily.    acetaminophen-codeine (TYLENOL #3) 300-30 MG tablet Take 1-1.5 tablets by mouth 2 (two) times daily as needed for moderate pain.    sertraline (ZOLOFT) 25 MG tablet Take 25 mg by mouth daily.         Allergies  Lisinopril and Latex  Review of Systems  Pertinent items noted in HPI and remainder of comprehensive ROS otherwise negative.  Physical Exam  BP 138/90 (BP Location: Left Arm)   Pulse 69   Temp 98.4 F (36.9 C) (Oral)   Resp 20   Ht 5\' 2"  (1.575 m)    Wt 95.7 kg   LMP 02/15/2020   BMI 38.59 kg/m   Lungs:  CTA B Cardio: RRR without M/R/G Abd: Soft, gravid, NT Presentation: cephalic EXT: No C/C/ 1+ Edema DTRs: 2+ B CERVIX:    See Prenatal records for more detailed PE.     FHR:  Variability: Good {> 6 bpm)  Toco: Uterine Contractions: Irreg  Test Results  Results for orders placed or performed during the hospital encounter of 11/24/20 (from the past 24 hour(s))  ABO/Rh     Status: None   Collection Time: 11/24/20  6:27 AM  Result Value Ref Range   ABO/RH(D)      B POS Performed at [facility], [address], [place], [address] [place]      Assessment   [place] at [redacted]w[redacted]d Estimated Date of Delivery: 12/01/20  The fetus is reassuring.   There are no problems to display for this patient.   Plan  1. Admit to L&D :   2. EFM: -- Category 1 3. Stadol or  Epidural if desired.   4. Admission labs  5. CD at 39 weeks  Elonda Husky, M.D. 11/24/2020 7:28 AM

## 2020-11-24 NOTE — TOC Progression Note (Addendum)
Transition of Care [facility]) - Progression Note    Patient Details  Name: Abigail Watts MRN: [id number] Date of Birth: Jun 02, 1991  [facility]) CM/SW Contact  [place] Cellar, RN Phone Number: 11/24/2020, 3:12 PM  Clinical Narrative:    RN CM discussed case with staff RN and confirmed hospital can provide car seat and will search for additional resources in the community for additional infant supplies.   Call to [facility] DSS CPS @ [phone]  Completed CPS report.   CPS report completed due to mother not having any needed items for infant. DSS requested hospital provide car seat, extra formula, diapers and wipes until they could make contact and assist.         Expected Discharge Plan and Services                                                 Social Determinants of Health (SDOH) Interventions    Readmission Risk Interventions No flowsheet data found.

## 2020-11-24 NOTE — Op Note (Signed)
      OP NOTE  Date: 11/24/2020   9:03 AM Name Abigail Watts MR# [id number]  Preoperative Diagnosis: 1. Intrauterine pregnancy at [redacted]w[redacted]d 2.  Pt desires repeat 3. Limited prenatal care  Postoperative Diagnosis: 1. Intrauterine pregnancy at [redacted]w[redacted]d, delivered 2. Viable infant 3. Remainder same as pre-op   Procedure: 1. Repeat Low-Transverse Cesarean Section  Surgeon: Elonda Husky, MD  Assistant:  Valentino Saxon MD  No other capable assistant was available for this surgery which requires an experienced, high level assistant.  She provided exposure, dissection, suctioning, retraction, and general support and assistance during the procedure.   Anesthesia: Spinal    EBL: 450 ml     Findings: 1) female infant, Apgar scores of 9   at 1 minute and 9   at 5 minutes and a birthweight of 119.58  ounces.    2) Normal uterus, tubes and ovaries.    Procedure:  The patient was prepped and draped in the supine position and placed under spinal anesthesia.  A transverse incision was made across the abdomen in a Pfannenstiel manner. If indicated the old scar was systematically removed with sharp dissection.  We carried the dissection down to the level of the fascia.  The fascia was incised in a curvilinear manner.  The fascia was then elevated from the rectus muscles with blunt and sharp dissection.  The rectus muscles were separated laterally exposing the peritoneum.  The peritoneum was carefully entered with care being taken to avoid bowel and bladder.  Adhesions from the uterus to the abdominal wall were encountered and systematically lysed. A self-retaining retractor was placed.  The visceral peritoneum was incised in a curvilinear fashion across the lower uterine segment creating a bladder flap. A transverse incision was made across the lower uterine segment and extended laterally and superiorly using the bandage scissors.  Artificial rupture membranes was performed and Clear fluid was noted.  The  infant was delivered from the cephalic position.  A nuchal cord was not present. After an appropriate time interval, the cord was doubly clamped and cut. Cord blood was obtained if required.  The infant was handed to the pediatric personnel  who then placed the infant under heat lamps where it was cleaned dried and suctioned as needed. The placenta was delivered. The hysterotomy incision was then identified on ring forceps.  The uterine cavity was cleaned with a moist lap sponge.  The hysterotomy incision was closed with a running interlocking suture of Vicryl.  Hemostasis was excellent.  Pitocin was run in the IV and the uterus was found to be firm. The posterior cul-de-sac and gutters were cleaned and inspected.  Hemostasis was noted.  The fascia was then closed with a running suture of #1 Vicryl.  Hemostasis of the subcutaneous tissues was obtained using the Bovie.  The subcutaneous tissues were closed with a running suture of 000 Vicryl.  A subcuticular suture was placed.  Steri-Strips were applied in the usual manner.  A pressure dressing was placed.  The patient went to the recovery room in stable condition.   Elonda Husky, M.D. 11/24/2020 9:03 AM

## 2020-11-24 NOTE — Anesthesia Preprocedure Evaluation (Signed)
Anesthesia Evaluation  Patient identified by MRN, date of birth, ID band Patient awake    Reviewed: Allergy & Precautions, H&P , NPO status , Patient's Chart, lab work & pertinent test results, reviewed documented beta blocker date and time   History of Anesthesia Complications Negative for: history of anesthetic complications  Airway Mallampati: II  TM Distance: >3 FB Neck ROM: full    Dental  (+) Teeth Intact, Dental Advidsory Given   Pulmonary neg shortness of breath, asthma , neg COPD, neg recent URI, former smoker,    Pulmonary exam normal breath sounds clear to auscultation       Cardiovascular Exercise Tolerance: Good hypertension, (-) angina(-) Past MI and (-) Cardiac Stents Normal cardiovascular exam(-) dysrhythmias (-) Valvular Problems/Murmurs Rhythm:regular Rate:Normal     Neuro/Psych  Headaches, neg Seizures PSYCHIATRIC DISORDERS Depression    GI/Hepatic negative GI ROS, Neg liver ROS,   Endo/Other  negative endocrine ROS  Renal/GU negative Renal ROS  negative genitourinary   Musculoskeletal   Abdominal   Peds  Hematology negative hematology ROS (+)   Anesthesia Other Findings Past Medical History: No date: Asthma No date: Depression No date: Hypertension No date: Migraines   Reproductive/Obstetrics negative OB ROS                             Anesthesia Physical Anesthesia Plan  ASA: II  Anesthesia Plan: Spinal   Post-op Pain Management:    Induction:   PONV Risk Score and Plan:   Airway Management Planned:   Additional Equipment:   Intra-op Plan:   Post-operative Plan:   Informed Consent: I have reviewed the patients History and Physical, chart, labs and discussed the procedure including the risks, benefits and alternatives for the proposed anesthesia with the patient or authorized representative who has indicated his/her understanding and acceptance.      Dental Advisory Given  Plan Discussed with: Anesthesiologist, CRNA and Surgeon  Anesthesia Plan Comments:         Anesthesia Quick Evaluation

## 2020-11-25 ENCOUNTER — Encounter: Payer: Self-pay | Admitting: Obstetrics and Gynecology

## 2020-11-25 ENCOUNTER — Inpatient Hospital Stay: Payer: Medicaid Other

## 2020-11-25 ENCOUNTER — Inpatient Hospital Stay (HOSPITAL_BASED_OUTPATIENT_CLINIC_OR_DEPARTMENT_OTHER): Payer: Medicaid Other

## 2020-11-25 LAB — CBC
HCT: 28.1 % — ABNORMAL LOW (ref 36.0–46.0)
Hemoglobin: 8.9 g/dL — ABNORMAL LOW (ref 12.0–15.0)
MCH: 24.3 pg — ABNORMAL LOW (ref 26.0–34.0)
MCHC: 31.7 g/dL (ref 30.0–36.0)
MCV: 76.6 fL — ABNORMAL LOW (ref 80.0–100.0)
Platelets: 132 10*3/uL — ABNORMAL LOW (ref 150–400)
RBC: 3.67 MIL/uL — ABNORMAL LOW (ref 3.87–5.11)
RDW: 15.1 % (ref 11.5–15.5)
WBC: 7.7 10*3/uL (ref 4.0–10.5)
nRBC: 0 % (ref 0.0–0.2)

## 2020-11-25 LAB — BASIC METABOLIC PANEL
Anion gap: 7 (ref 5–15)
BUN: 7 mg/dL (ref 6–20)
CO2: 22 mmol/L (ref 22–32)
Calcium: 8.4 mg/dL — ABNORMAL LOW (ref 8.9–10.3)
Chloride: 107 mmol/L (ref 98–111)
Creatinine, Ser: 0.68 mg/dL (ref 0.44–1.00)
GFR, Estimated: >60 mL/min (ref >60)
Glucose, Bld: 123 mg/dL — ABNORMAL HIGH (ref 70–99)
Potassium: 3.6 mmol/L (ref 3.5–5.1)
Sodium: 136 mmol/L (ref 135–145)

## 2020-11-25 LAB — HEPATITIS B SURFACE ANTIGEN

## 2020-11-25 LAB — RPR: RPR Ser Ql: NONREACTIVE

## 2020-11-25 LAB — RUBELLA SCREEN: Rubella: <0.9 index — ABNORMAL LOW (ref >0.99)

## 2020-11-25 IMAGING — CT CT ABD-PELV W/ CM
2 of 5 series · 16 of 46 positions shown, 18 images · IV contrast (APPLIED)
Comparison: None.

CLINICAL DATA: Abdominal pain, fever postop. Patient complained of
[DATE] pain that has been unrelieved by PRN pain medications ordered.
Cesarean section postop day 1.

EXAM:
CT ABDOMEN AND PELVIS WITH CONTRAST
TECHNIQUE: Multidetector CT imaging of the abdomen and pelvis was performed
using the standard protocol following bolus administration of
intravenous contrast.
CONTRAST:  Intravenous contrast administered.

[Series 2: routine abd/pel with · axial · 0.76mm/px · z∈[-887,-502]mm · 13 of 91 slices shown, 15 images]
[im 7/91  soft-tissue]
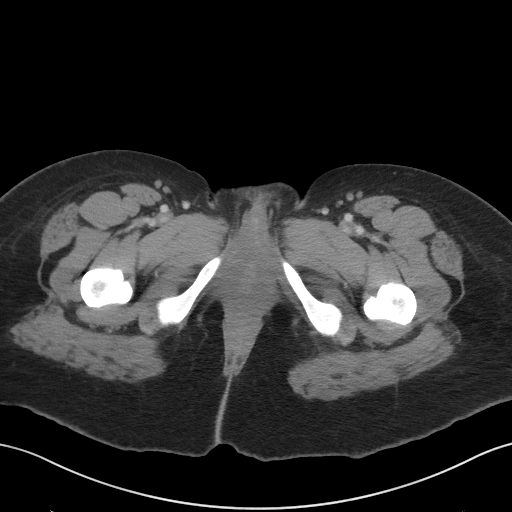
[im 7/91  bone]
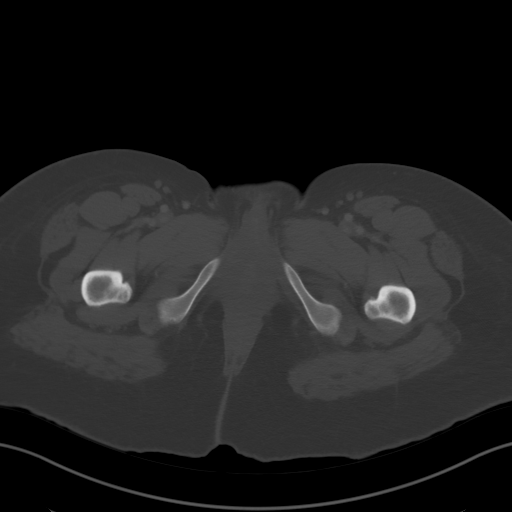
[im 13/91  soft-tissue]
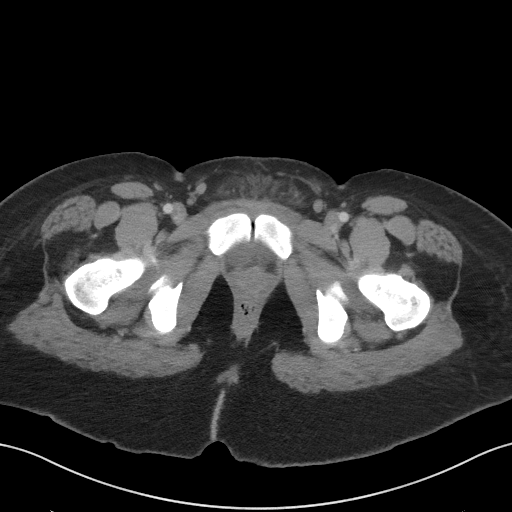
[im 20/91  soft-tissue]
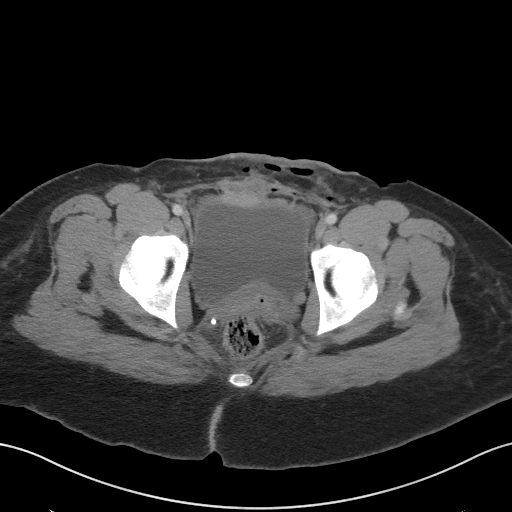
[im 26/91  soft-tissue]
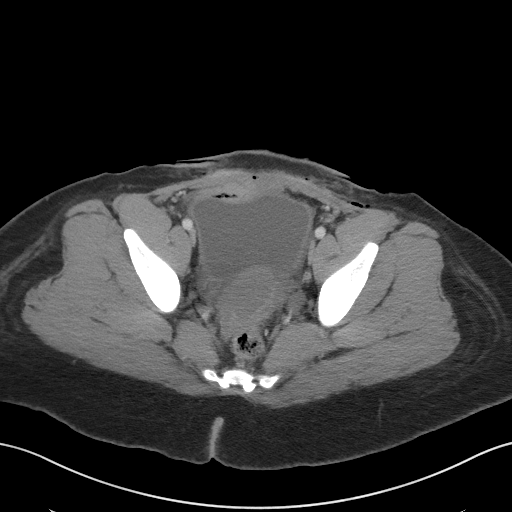
[im 33/91  soft-tissue]
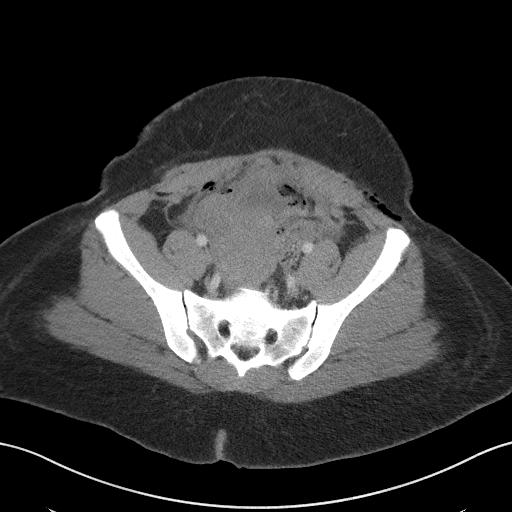
[im 39/91  soft-tissue]
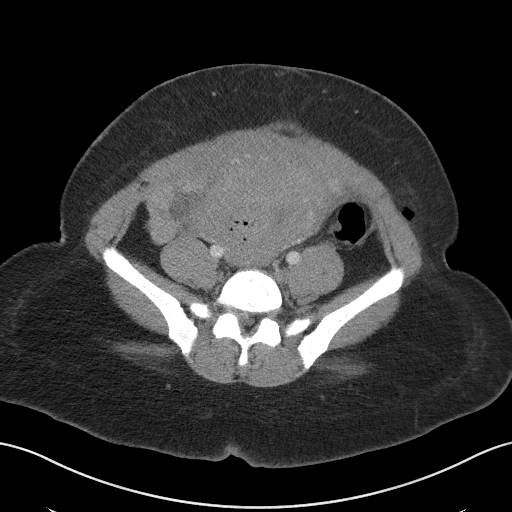
[im 46/91  soft-tissue]
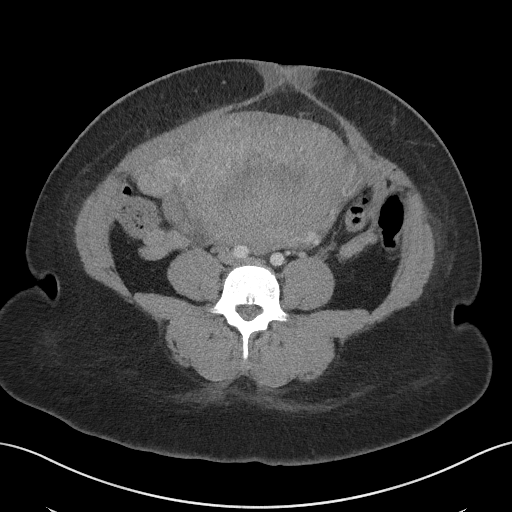
[im 52/91  soft-tissue]
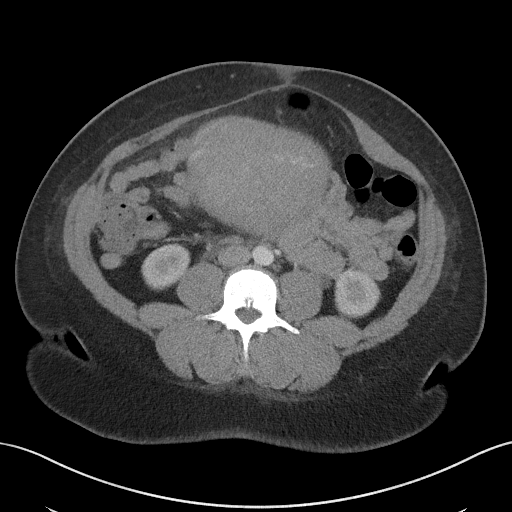
[im 58/91  soft-tissue]
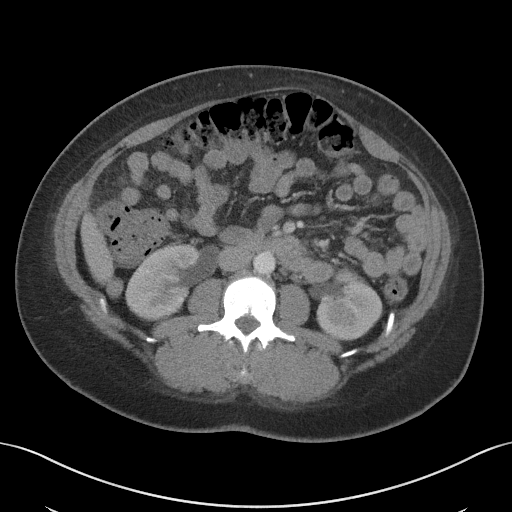
[im 58/91  bone]
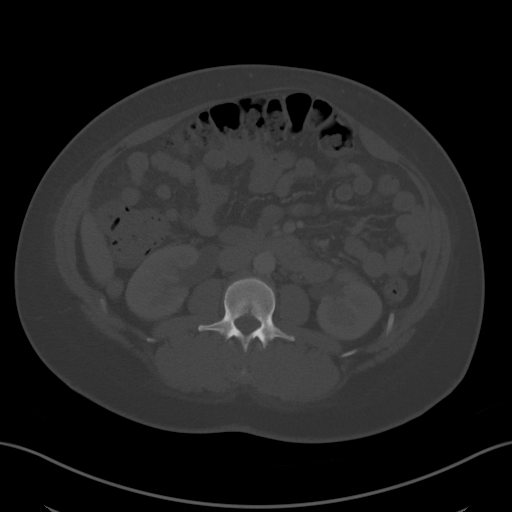
[im 65/91  soft-tissue]
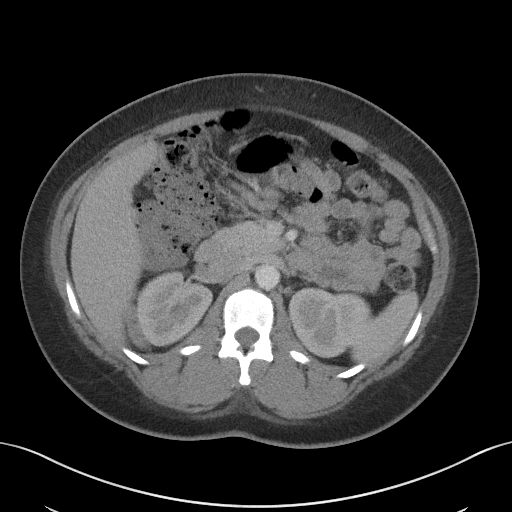
[im 71/91  soft-tissue]
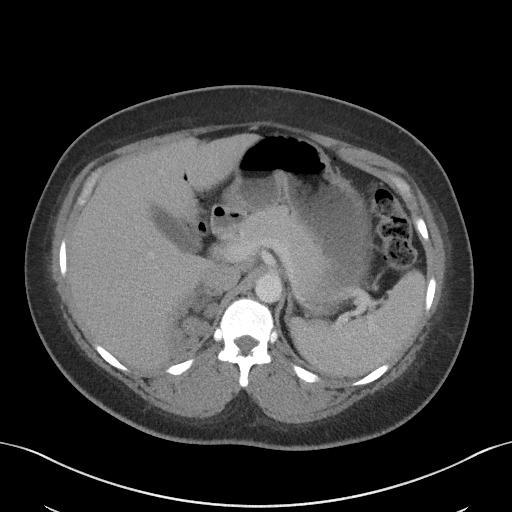
[im 78/91  soft-tissue]
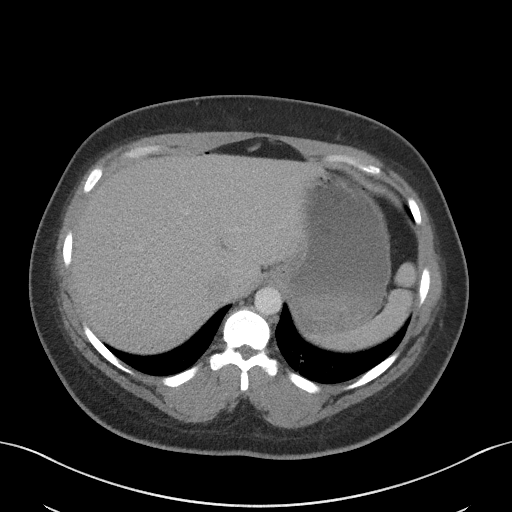
[im 84/91  soft-tissue]
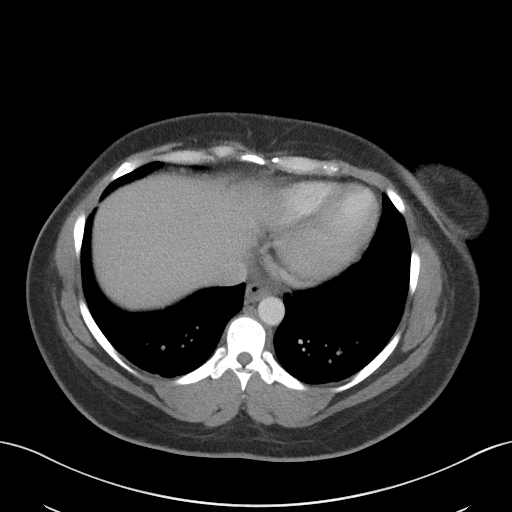

[Series 5: coronal st · coronal · 0.86mm/px · 3 of 100 slices shown]
[im 34/100  soft-tissue]
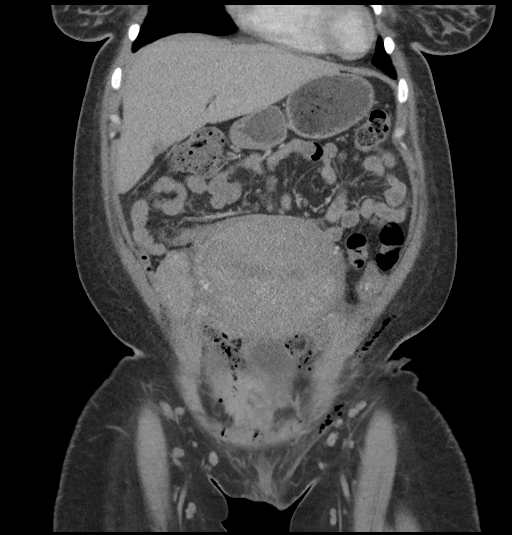
[im 45/100  soft-tissue]
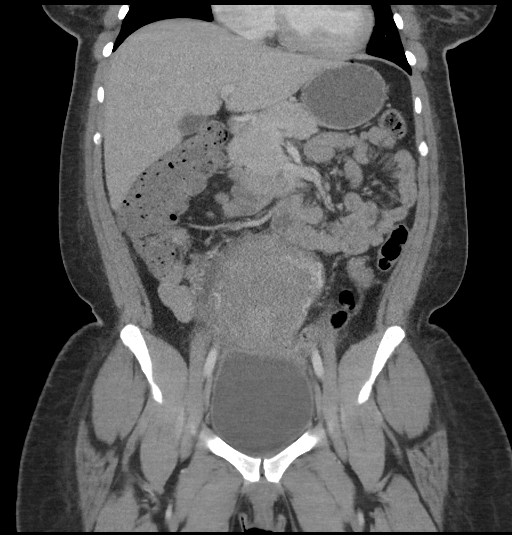
[im 56/100  soft-tissue]
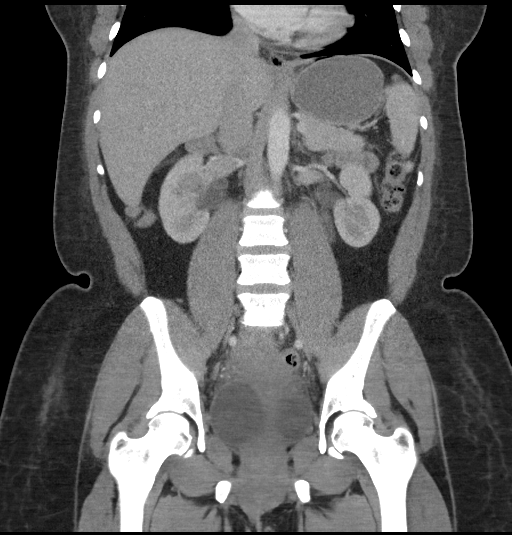

[16 of 46 positions shown; findings below may reference images not displayed]

FINDINGS: Lower chest: Bilateral lower lobe subsegmental atelectasis.

Hepatobiliary: No focal liver abnormality. No gallstones,
gallbladder wall thickening, or pericholecystic fluid. No biliary
dilatation.

Pancreas: No focal lesion. Normal pancreatic contour. No surrounding
inflammatory changes. No main pancreatic ductal dilatation.

Spleen: Normal in size without focal abnormality. A splenule is
noted ([DATE]).

Adrenals/Urinary Tract: No adrenal nodule bilaterally. Bilateral
kidneys enhance symmetrically. No hydronephrosis. Fullness of
bilateral collecting systems with no hydroureter. No
nephroureterolithiasis. The urinary bladder is unremarkable. On
delayed imaging, there is no urothelial wall thickening and there
are no filling defects in the opacified portions of the proximal
collecting systems (the pelvis and calices).

Stomach/Bowel: Stomach is within normal limits. No evidence of bowel
wall thickening or dilatation. Appendix appears normal.

Vascular/Lymphatic: No significant vascular findings are present. No
enlarged abdominal or pelvic lymph nodes.

Reproductive: Postop surgical changes related to C-section with
associated expected endometrial gas and intraperitoneal gas.
Otherwise the uterus and bilateral adnexal regions are unremarkable.

Other: Trace volume free intraperitoneal gas and fluid within the
pelvis. No organized fluid collection.

Musculoskeletal:

Healing lower anterior abdominal incision with associated
subcutaneus soft tissue edema and emphysema which is consistent with
a postsurgical state.

No suspicious lytic or blastic osseous lesions. No acute displaced
fracture.
IMPRESSION: 1. Status post cesarean section with expected postsurgical changes.
2. Please note, delayed imaging only demonstrates partial filling
within the bilateral pelvocalyceal collecting systems. No evaluation
distally on the delayed images. If concern for ureteral or urinary
bladder injury, consider repeating the delayed scan to evaluate for
extravasation of excreted intravenous contrast.

## 2020-11-25 MED ORDER — ONDANSETRON 4 MG PO TBDP: 4.0000 mg | ORAL_TABLET | Freq: Three times a day (TID) | ORAL | Status: DC | PRN

## 2020-11-25 MED ORDER — GABAPENTIN 300 MG PO CAPS
600.0000 mg | ORAL_CAPSULE | Freq: Four times a day (QID) | ORAL | Status: DC
  Administered 2020-11-25 – 2020-11-26 (×2): 600 mg via ORAL
  Filled 2020-11-25 (×2): qty 2

## 2020-11-25 MED ORDER — HYDROMORPHONE HCL 2 MG PO TABS
2.0000 mg | ORAL_TABLET | ORAL | Status: DC | PRN
  Administered 2020-11-25 – 2020-11-26 (×2): 2 mg via ORAL
  Filled 2020-11-25 (×2): qty 1

## 2020-11-25 MED ORDER — LIDOCAINE 5 % EX PTCH
1.0000 | MEDICATED_PATCH | Freq: Once | CUTANEOUS | Status: AC | PRN
  Administered 2020-11-25: 1 via TRANSDERMAL

## 2020-11-25 MED ORDER — IBUPROFEN 800 MG PO TABS
800.0000 mg | ORAL_TABLET | Freq: Three times a day (TID) | ORAL | Status: DC
  Administered 2020-11-25: 800 mg via ORAL
  Filled 2020-11-25: qty 1

## 2020-11-25 MED ORDER — IBUPROFEN 800 MG PO TABS
800.0000 mg | ORAL_TABLET | Freq: Three times a day (TID) | ORAL | Status: DC
Start: 2020-11-27 — End: 2020-11-26

## 2020-11-25 MED ORDER — ACETAMINOPHEN 500 MG PO TABS
1000.0000 mg | ORAL_TABLET | Freq: Four times a day (QID) | ORAL | Status: DC
  Administered 2020-11-25 – 2020-11-26 (×2): 1000 mg via ORAL
  Filled 2020-11-25 (×3): qty 2

## 2020-11-25 MED ORDER — SIMETHICONE 80 MG PO CHEW
80.0000 mg | CHEWABLE_TABLET | Freq: Four times a day (QID) | ORAL | Status: DC
  Administered 2020-11-25: 80 mg via ORAL
  Filled 2020-11-25: qty 1

## 2020-11-25 MED ORDER — KETOROLAC TROMETHAMINE 30 MG/ML IJ SOLN
30.0000 mg | Freq: Four times a day (QID) | INTRAMUSCULAR | Status: DC
  Administered 2020-11-25 – 2020-11-26 (×2): 30 mg via INTRAVENOUS
  Filled 2020-11-25 (×2): qty 1

## 2020-11-25 MED ORDER — MORPHINE SULFATE (PF) 2 MG/ML IV SOLN
2.0000 mg | Freq: Once | INTRAVENOUS | Status: AC
  Administered 2020-11-25: 2 mg via INTRAVENOUS
  Filled 2020-11-25: qty 1

## 2020-11-25 MED ORDER — ONDANSETRON 4 MG PO TBDP
ORAL_TABLET | ORAL | Status: AC
  Administered 2020-11-25: 4 mg via ORAL
  Filled 2020-11-25: qty 1

## 2020-11-25 MED ORDER — HYDROCODONE-ACETAMINOPHEN 5-325 MG PO TABS
2.0000 | ORAL_TABLET | ORAL | Status: DC | PRN
Start: 2020-11-25 — End: 2020-11-25
  Administered 2020-11-25: 2 via ORAL

## 2020-11-25 MED ORDER — HYDROCORTISONE 1 % EX CREA
TOPICAL_CREAM | Freq: Four times a day (QID) | CUTANEOUS | Status: DC | PRN
  Filled 2020-11-25: qty 28

## 2020-11-25 MED ORDER — IOHEXOL 300 MG/ML  SOLN
100.0000 mL | Freq: Once | INTRAMUSCULAR | Status: AC | PRN
  Administered 2020-11-25: 100 mL via INTRAVENOUS

## 2020-11-25 NOTE — Progress Notes (Signed)
Pt back in rm from CT

## 2020-11-25 NOTE — Progress Notes (Signed)
Reviewed CT scan, unremarkable. Per nurse, patient still noting some pain, but states that it has improved some since receiving the morphine. Just received another dose of Vicodin.  Continue to monitor.    Imaging:  CT ABDOMEN PELVIS W CONTRAST CLINICAL DATA:  Abdominal pain, fever postop. Patient complained of 10/10 pain that has been unrelieved by PRN pain medications ordered. Cesarean section postop day 1.  EXAM: CT ABDOMEN AND PELVIS WITH CONTRAST  TECHNIQUE: Multidetector CT imaging of the abdomen and pelvis was performed using the standard protocol following bolus administration of intravenous contrast.  CONTRAST:  Intravenous contrast administered.  COMPARISON:  None.  FINDINGS: Lower chest: Bilateral lower lobe subsegmental atelectasis.  Hepatobiliary: No focal liver abnormality. No gallstones, gallbladder wall thickening, or pericholecystic fluid. No biliary dilatation.  Pancreas: No focal lesion. Normal pancreatic contour. No surrounding inflammatory changes. No main pancreatic ductal dilatation.  Spleen: Normal in size without focal abnormality. A splenule is noted (5:58).  Adrenals/Urinary Tract: No adrenal nodule bilaterally. Bilateral kidneys enhance symmetrically. No hydronephrosis. Fullness of bilateral collecting systems with no hydroureter. No nephroureterolithiasis. The urinary bladder is unremarkable. On delayed imaging, there is no urothelial wall thickening and there are no filling defects in the opacified portions of the proximal collecting systems (the pelvis and calices).  Stomach/Bowel: Stomach is within normal limits. No evidence of bowel wall thickening or dilatation. Appendix appears normal.  Vascular/Lymphatic: No significant vascular findings are present. No enlarged abdominal or pelvic lymph nodes.  Reproductive: Postop surgical changes related to C-section with associated expected endometrial gas and intraperitoneal gas. Otherwise the  uterus and bilateral adnexal regions are unremarkable.  Other: Trace volume free intraperitoneal gas and fluid within the pelvis. No organized fluid collection.  Musculoskeletal:  Healing lower anterior abdominal incision with associated subcutaneus soft tissue edema and emphysema which is consistent with a postsurgical state.  No suspicious lytic or blastic osseous lesions. No acute displaced fracture.  IMPRESSION: 1. Status post cesarean section with expected postsurgical changes. 2. Please note, delayed imaging only demonstrates partial filling within the bilateral pelvocalyceal collecting systems. No evaluation distally on the delayed images. If concern for ureteral or urinary bladder injury, consider repeating the delayed scan to evaluate for extravasation of excreted intravenous contrast.  Electronically Signed   By: Tish Frederickson M.D.   On: 11/25/2020 03:11    Hildred Laser, MD [facility]

## 2020-11-25 NOTE — Progress Notes (Signed)
   11/25/20 1522  Vital Signs  BP 133/84  BP Location Right Arm  Patient Position (if appropriate) Supine  BP Method Automatic  Pulse Rate 67  Resp 18  Temp 98.6 F (37 C)  Temp Source Oral  Pain Assessment  Pain Assessment 0-10  Pain Score 10  Pain Location Abdomen  Pain Orientation Upper;Mid;Lower  Pain Descriptors / Indicators Constant;Sharp;Sore;Tender  Pain Frequency Constant  Pain Onset On-going  Pain Intervention(s) MD notified (Comment) (RN called Dr. Logan Bores)    Patient was taking a shower when she pulled call cord. Nurse Tech went in immediately and I followed one minute after when nurse tech called out for assistance. When I came in, patient was straddling the shower, holding onto Minerva, NT. I quickly got a wheelchair and Minerva and I helped the patient safely into the wheelchair then to the bed. Patient was oriented and awake but felt dizzy "like she was going to pass out". VS were taken. See flowsheets. After lying down flat for 3 minutes patient no longer felt dizzy but was crying from pain. MD called by the patient's nurse.

## 2020-11-25 NOTE — Progress Notes (Signed)
Called to room by patient, requests RN to page MD. Current pain control is not effective, pt rates pain 8-9/10 and is tearful. Reports some improvement in incision pain after lidocaine patch applied. Also using a k-pad for comfort. Pt asked for "gas medicine" (simethicone) when RN returns. Spoke with Dr. Valentino Saxon, orders placed for pain meds, recommend ambulation, chewing gum, carbonated beverage to aid with reducing gas pain, also changed order for PRN simethicone to scheduled 4x daily. D/C'd orders for Norco, replaced with dilaudid, tylenol, toradol, and gabapentin. Pt agreeable to plan, had some concerns with side effects of drowsiness and infant safety, notified oncoming nurse of pt's concerns.

## 2020-11-25 NOTE — Progress Notes (Signed)
Spoke with Dr. Logan Bores, pt reports increased pain after incident. Complains of gas pain radiating into her shoulder. Dr. Logan Bores concerned that narcotics may have contributed to dizziness in shower, order for oxycodone D/C'd, increased Norco to every 4 hours. PRN zofran 4mg  dissolving tablet order placed for nausea, as well as lidocaine patch to incision and k-pad for comfort. Pt placed in left side lying position with abdominal binder to aid with gas movement/discomfort. Pt resting, will continue to monitor.

## 2020-11-25 NOTE — Progress Notes (Signed)
Patient complained of 10/10 pain that has been unrelieved by PRN pain medications ordered. Pt described pain in lower abdomen as a "sharp shooting pain that radiates down the right leg". Dr Valentino Saxon notified. New orders placed for STAT CT scan, labs and IV morphine.

## 2020-11-25 NOTE — Progress Notes (Addendum)
Pt off floor transported to CT. Irving Burton, transition RN in rm 340 with baby girl. Patient made aware.

## 2020-11-25 NOTE — Progress Notes (Signed)
Progress Note - Cesarean Delivery  Abigail Watts is a 30 y.o. [id number] now PP day 1 s/p C-Section, Low Transverse .   Subjective:  Patient reports no problems with eating, bowel movements, voiding, or their wound  Pt had uncontrolled pain through the night.    This has rapidly resolved into what seems to be normal post CD pain.  Pt oob, eating without problem  Objective:  Vital signs in last 24 hours: Temp:  [97.5 F (36.4 C)-98.3 F (36.8 C)] 98 F (36.7 C) (01/29 0830) Pulse Rate:  [56-87] 69 (01/29 0830) Resp:  [18-20] 18 (01/29 0830) BP: (120-142)/(70-92) 142/90 (01/29 0830) SpO2:  [93 %-100 %] 98 % (01/29 0830)  Physical Exam:  General: alert, cooperative and no distress Lochia: appropriate Uterine Fundus: firm Incision: dressing intact    Data Review Recent Labs    11/22/20 1030 11/25/20 0343  HGB 10.5* 8.9*  HCT 32.8* 28.1*    Assessment:  Active Problems:   * No active hospital problems. *   Status post Cesarean section. Postoperative course complicated by intractable pain - NOW RESOLVED   No obvious cause of her pain discovered - Negative CT  Plan:       Continue current care.  SS to see today regarding H/O depression and housing situation.  Elonda Husky, M.D. 11/25/2020 10:01 AM

## 2020-11-26 ENCOUNTER — Other Ambulatory Visit: Payer: Self-pay | Admitting: Obstetrics and Gynecology

## 2020-11-26 DIAGNOSIS — Z3490 Encounter for supervision of normal pregnancy, unspecified, unspecified trimester: Secondary | ICD-10-CM

## 2020-11-26 DIAGNOSIS — Z9889 Other specified postprocedural states: Secondary | ICD-10-CM

## 2020-11-26 LAB — HEPATITIS B SURFACE ANTIGEN: Hepatitis B Surface Ag: NONREACTIVE

## 2020-11-26 MED ORDER — IBUPROFEN 800 MG PO TABS: 800.0000 mg | ORAL_TABLET | Freq: Three times a day (TID) | ORAL | 0 refills | Status: DC

## 2020-11-26 MED ORDER — OXYCODONE HCL 5 MG PO TABS: 5.0000 mg | ORAL_TABLET | ORAL | 0 refills | Status: DC | PRN

## 2020-11-26 MED ORDER — OXYCODONE HCL 5 MG PO TABS
5.0000 mg | ORAL_TABLET | ORAL | Status: DC | PRN
Start: 2020-11-26 — End: 2020-11-26
  Filled 2020-11-26: qty 1

## 2020-11-26 NOTE — Progress Notes (Signed)
Reviewed D/C instructions with pt and family. Pt verbalized understanding of teaching. Discharged to home via W/C. Pt to schedule f/u appt.  

## 2020-11-26 NOTE — Progress Notes (Signed)
Prescriptions sent in to pharmacy by provider. Pt's pharmacy closed, Rx's sent to [facility] in [place]. 

## 2020-11-26 NOTE — Discharge Summary (Signed)
Physician Obstetric Discharge Summary  Patient Name: Abigail Watts DOB: Mar 19, 1991 MRN: [id number]                            Discharge Summary  Date of Admission: 11/24/2020 Date of Discharge: 11/26/2020 Delivering Provider: Linzie Collin   Admitting Diagnosis: Term Intrauterine pregnancy for repeat cesarean at [redacted]w[redacted]d Secondary diagnosis:  Active Problems:   * No active hospital problems. *   Mode of Delivery:       low uterine, transverse     Discharge diagnosis: Term Pregnancy Delivered      Intrapartum Procedures:    Post partum procedures:   Complications: Difficult PP pain control                     Discharge Day SOAP Note:  Subjective:  The patient has no complaints.  She is ambulating well. She is taking PO well. Pain is well controlled with current medications. Passing flatus.  She says she is "a lot" better this morning. Says the pain occasionally comes in waves like "gas pain".  She says she will do fine if discharged with Oxy and Motrin. Patient is urinating without difficulty.   She is passing flatus.  She specifically is requesting discharge.     Objective  Vital signs: BP 138/88 (BP Location: Left Arm)   Pulse 67   Temp 98.3 F (36.8 C) (Oral)   Resp 18   Ht 5\' 2"  (1.575 m)   Wt 95.7 kg   LMP 02/15/2020   SpO2 99% Comment: Room Air  Breastfeeding Unknown   BMI 38.59 kg/m   Physical Exam: Gen: NAD Abdomen: soft nt - non-distended. clean, dry, no drainage Fundus Fundal Tone: Firm  Lochia Amount: Scant     Data Review Labs: Lab Results  Component Value Date   WBC 7.7 11/25/2020   HGB 8.9 (L) 11/25/2020   HCT 28.1 (L) 11/25/2020   MCV 76.6 (L) 11/25/2020   PLT 132 (L) 11/25/2020   CBC Latest Ref Rng & Units 11/25/2020 11/22/2020  WBC 4.0 - 10.5 K/uL 7.7 5.8  Hemoglobin 12.0 - 15.0 g/dL 11/24/2020) 10.5(L)  Hematocrit 36.0 - 46.0 % 28.1(L) 32.8(L)  Platelets 150 - 400 K/uL 132(L) 165   B POS Performed at [facility],  [address] [place], [place]   [address] Score: Inocente Salles Postnatal Depression Scale Screening Tool 11/25/2020  I have been able to laugh and see the funny side of things. 1  I have looked forward with enjoyment to things. 0  I have blamed myself unnecessarily when things went wrong. 2  I have been anxious or worried for no good reason. 2  I have felt scared or panicky for no good reason. 2  Things have been getting on top of me. 1  I have been so unhappy that I have had difficulty sleeping. 2  I have felt sad or miserable. 3  I have been so unhappy that I have been crying. 2  The thought of harming myself has occurred to me. 0  Edinburgh Postnatal Depression Scale Total 15    Assessment:  Active Problems:   * No active hospital problems. *   Doing well.  Normal progress as expected. Pt desires discharge. Plan:  Discharge to home  Modified rest as directed - may slowly resume normal activities with restrictions  as discussed.  Medications as written.  See below for  additional.      Discharge Instructions: Per After Visit Summary. Activity: Advance as tolerated. Pelvic rest for 6 weeks.  Also refer to After Visit Summary.  Wound care discussed. Diet: Regular Medications: Allergies as of 11/26/2020      Reactions   Lisinopril Swelling, Other (See Comments)   Facial swellin   Latex Hives, Itching, Rash      Medication List    STOP taking these medications   acetaminophen 500 MG tablet Commonly known as: TYLENOL   acetaminophen-codeine 300-30 MG tablet Commonly known as: TYLENOL #3   sertraline 25 MG tablet Commonly known as: ZOLOFT     TAKE these medications   ibuprofen 800 MG tablet Commonly known as: ADVIL Take 1 tablet (800 mg total) by mouth 3 (three) times daily. Start taking on: November 27, 2020   multivitamin-prenatal 27-0.8 MG Tabs tablet Take 1 tablet by mouth daily.   oxyCODONE 5 MG immediate release tablet Commonly known as: Oxy  IR/ROXICODONE Take 1 tablet (5 mg total) by mouth every 4 (four) hours as needed for severe pain.      Outpatient follow up:   Follow-up Information    Linzie Collin, MD Follow up in 1 week(s).   Specialties: Obstetrics and Gynecology, Radiology Contact information: [address] [place] [address] [phone]              Postpartum contraception: Will discuss at first post-partum visit.  Discharged Condition: good  Discharged to: home  Newborn Data: Disposition:home with mother  Apgars: APGAR (1 MIN): 9   APGAR (5 MINS): 9   APGAR (10 MINS):    Baby Feeding: Bottle  Elonda Husky, M.D. 11/26/2020 9:49 AM

## 2020-11-26 NOTE — Clinical Social Work Maternal (Signed)
CLINICAL SOCIAL WORK MATERNAL/CHILD NOTE  Patient Details  Name: Abigail Watts MRN: [id number] Date of Birth: 1991-01-19  Date:  11/26/2020  Clinical Social Worker Initiating Note:  late prenatal care and positive Edinburgh Date/Time: Initiated:  11/26/20/0935     Child's Name:  Abigail Watts   Biological Parents:  Mother,Father   Need for Interpreter:  None   Reason for Referral:  Late or No Prenatal Care ,Behavioral Health Concerns (Hx of postpartum depression)   Address:  [address] [place] [address]    Phone number:  [phone] (home)     Additional phone number: [phone]  Household Members/Support Persons (HM/SP):   Household Member/Support Person 1   HM/SP Name Relationship DOB or Age  HM/SP -1 Abigail Watts    HM/SP -2        HM/SP -3        HM/SP -4        HM/SP -5        HM/SP -6        HM/SP -7        HM/SP -8          Natural Supports (not living in the home):  Children,Community,Extended [employer] Family   Professional Supports:     Employment: Animator   Type of Work: Programmer, applications at [employer]   Education:  [place] arranged:    Museum/gallery curator Resources:  Medicaid   Other Resources:  Food Stamps    Cultural/Religious Considerations Which May Impact Care:  Not indicated.   Strengths:  Ability to meet basic needs ,Home prepared for child    Psychotropic Medications:         Pediatrician:       Pediatrician List:   [facility]      Pediatrician Fax Number:    Risk Factors/Current Problems:      Cognitive State:  Alert    Mood/Affect:  Comfortable ,Calm    CSW Assessment: CSW met with patient at bedside due to late prenatal care and positive Edinburgh . CSW explain HIPPA. Social worker explained to the patient reason for the consult.  Patient is a 30 year old female  who gave birth to a baby boy on 11/24/2020. Patient reported that she is doing well.  She reported that she struggles with depress and has a history of postpartum depression.  She struggled with postpartum depression after the birth of her 66- and 50-year-old child.  Denied outpatient treatment.  Denied medication therapy.   Patient denied SI/HI/DV.  Psychoeducation about Postpartum depression. SW explained to the patient that feelings or thoughts of self-harm and harming her baby, extreme fear about baby's wellbeing, not accepting help from support, not wanting to engage in activities or leave the home, not wanting to care for baby, or feeling you are the only one who can care for baby are all symptoms of postpartum depression. SW encouraged mother of baby to asked for help/purport by consulting her primary care provider and or talking to her supports about what is happening.  Postpartum: First 2 weeks after delivery emotions may be up and down due hormones rebalancing. If symptoms occur after that 2 week it could be PPD. Patient reported that her home is set up for her newborn. Patient reported that father of newborn, Abigail Watts is a good support.  CSW Plan/Description:  Patient was encouraged to make follow-up appointment with her primary care provider. List of [facility] PCP.   CSW Plan/Description:  No Further Intervention Required/No Barriers to Discharge    Berenice Bouton, LCSW 11/26/2020, 9:39 AM

## 2020-11-26 NOTE — Anesthesia Postprocedure Evaluation (Signed)
Anesthesia Post Note  Patient: Abigail Watts  Procedure(s) Performed: CESAREAN SECTION (N/A )  Patient location during evaluation: Mother Baby Anesthesia Type: Spinal Level of consciousness: awake and alert and oriented Pain management: pain level controlled Vital Signs Assessment: post-procedure vital signs reviewed and stable Respiratory status: spontaneous breathing Cardiovascular status: blood pressure returned to baseline Postop Assessment: no headache, no backache and able to ambulate Anesthetic complications: no Comments: Patient doing well today and ambulating well today with no complaints of leg pain that she complained about yesterday after the previous days C-S.  Patient is ready for discharge.   No complications documented.   Last Vitals:  Vitals:   11/25/20 2332 11/26/20 0833  BP: 125/81 138/88  Pulse: 62 67  Resp: 18 18  Temp: 36.7 C 36.8 C  SpO2:  99%    Last Pain:  Vitals:   11/26/20 0833  TempSrc: Oral  PainSc:                  Sarinity Dicicco

## 2020-11-26 NOTE — Progress Notes (Signed)
Prescriptions sent in to pharmacy by provider. Pt's pharmacy closed, Rx's sent to [facility] in [place].

## 2020-11-26 NOTE — Anesthesia Post-op Follow-up Note (Signed)
  Anesthesia Pain Follow-up Note  Patient: Abigail Watts  Day #: 2  Date of Follow-up: 11/26/2020 Time: 11:24 AM  Last Vitals:  Vitals:   11/25/20 2332 11/26/20 0833  BP: 125/81 138/88  Pulse: 62 67  Resp: 18 18  Temp: 36.7 C 36.8 C  SpO2:  99%    Level of Consciousness: alert  Pain: none   Side Effects:None  Catheter Site Exam:clean, dry, no drainage     Plan: D/C from anesthesia care at surgeon's request  Soliyana Mcchristian

## 2020-11-27 ENCOUNTER — Encounter: Payer: Self-pay | Admitting: Obstetrics and Gynecology

## 2020-11-28 ENCOUNTER — Encounter: Payer: Self-pay | Admitting: Obstetrics and Gynecology

## 2020-12-04 ENCOUNTER — Encounter: Payer: Medicaid Other | Admitting: Obstetrics and Gynecology

## 2020-12-04 ENCOUNTER — Ambulatory Visit: Payer: Self-pay | Admitting: Obstetrics and Gynecology

## 2020-12-12 ENCOUNTER — Telehealth: Payer: Self-pay | Admitting: Obstetrics and Gynecology

## 2020-12-12 ENCOUNTER — Encounter: Payer: Self-pay | Admitting: Obstetrics and Gynecology

## 2020-12-12 NOTE — Telephone Encounter (Signed)
Patient no showed appointment - letter sent by mail.
